# Patient Record
Sex: Female | Born: 1970
Health system: Southern US, Community
[De-identification: ages and names within clinical notes are randomized; demographics above are authoritative.]

## PROBLEM LIST (undated history)

## (undated) DIAGNOSIS — G43909 Migraine, unspecified, not intractable, without status migrainosus: Secondary | ICD-10-CM

---

## 1998-03-12 ENCOUNTER — Emergency Department (HOSPITAL_COMMUNITY): Admission: EM | Admit: 1998-03-12 | Discharge: 1998-03-12 | Payer: Self-pay | Admitting: Emergency Medicine

## 2001-03-10 ENCOUNTER — Ambulatory Visit (HOSPITAL_COMMUNITY): Admission: RE | Admit: 2001-03-10 | Discharge: 2001-03-10 | Payer: Self-pay | Admitting: Obstetrics and Gynecology

## 2001-03-10 ENCOUNTER — Encounter: Payer: Self-pay | Admitting: Obstetrics and Gynecology

## 2001-06-03 ENCOUNTER — Encounter (INDEPENDENT_AMBULATORY_CARE_PROVIDER_SITE_OTHER): Payer: Self-pay | Admitting: *Deleted

## 2001-06-03 ENCOUNTER — Ambulatory Visit (HOSPITAL_BASED_OUTPATIENT_CLINIC_OR_DEPARTMENT_OTHER): Admission: RE | Admit: 2001-06-03 | Discharge: 2001-06-03 | Payer: Self-pay | Admitting: *Deleted

## 2001-10-20 ENCOUNTER — Other Ambulatory Visit: Admission: RE | Admit: 2001-10-20 | Discharge: 2001-10-20 | Payer: Self-pay | Admitting: Obstetrics & Gynecology

## 2002-05-18 ENCOUNTER — Encounter: Payer: Self-pay | Admitting: Obstetrics and Gynecology

## 2002-05-18 ENCOUNTER — Inpatient Hospital Stay (HOSPITAL_COMMUNITY): Admission: AD | Admit: 2002-05-18 | Discharge: 2002-05-20 | Payer: Self-pay | Admitting: Obstetrics & Gynecology

## 2002-06-29 ENCOUNTER — Other Ambulatory Visit: Admission: RE | Admit: 2002-06-29 | Discharge: 2002-06-29 | Payer: Self-pay | Admitting: Obstetrics & Gynecology

## 2008-04-08 ENCOUNTER — Other Ambulatory Visit: Admission: RE | Admit: 2008-04-08 | Discharge: 2008-04-08 | Payer: Self-pay | Admitting: Family Medicine

## 2010-06-23 ENCOUNTER — Ambulatory Visit (HOSPITAL_BASED_OUTPATIENT_CLINIC_OR_DEPARTMENT_OTHER): Admission: RE | Admit: 2010-06-23 | Discharge: 2010-06-23 | Payer: Self-pay | Admitting: Psychology

## 2010-11-15 LAB — POCT HEMOGLOBIN-HEMACUE: Hemoglobin: 12.9 g/dL (ref 12.0–15.0)

## 2011-01-19 NOTE — Op Note (Signed)
   NAME:  Sara Stephens, LAGUE NO.:  [id number]   MEDICAL RECORD NO.:  [id number]                   PATIENT TYPE:  INP   LOCATION:  9165                                 FACILITY:  [facility]   PHYSICIAN:  Lenoard Aden, M.D.             DATE OF BIRTH:  September 25, 1970   DATE OF PROCEDURE:  05/18/2002  DATE OF DISCHARGE:                                 OPERATIVE REPORT   INDICATION FOR OPERATIVE VAGINAL DELIVERY:  Severe right lower quadrant  discomfort with pushing, requests assistance.   POSTOPERATIVE DIAGNOSES:  Severe right lower quadrant discomfort with  pushing, requests assistance.   PROCEDURE:  Outlet vacuum assisted delivery for maternal exhaustion.   DESCRIPTION OF PROCEDURE:  After apprised of the risks, benefits of vacuum  assistance versus redosing of her epidural, patient desires to proceed with  vacuum assistance.  Cervix at 10 cm dilatation.  Fetal vertex at +3 station,  LOA, less than 45 degrees.  MityVac mushroom cup is applied after removal of  the Foley and proper location for three pulls of a full-term living female  over second degree perineal laceration.  Apgars 8 and 9.  Bulb suction  performed.  Placenta delivered spontaneously intact.  Three vessel cord.  There is a small internal left of midline vaginal laceration which is  repaired with a 3-0 Rapide and two small left and left periurethral  lacerations which have two interrupted 3-0 Rapide sutures placed.  Cervix is  without any evidence of any lacerations.  Rectal examination is intact.  Cervix is without lacerations.  Estimated blood loss 600 cc.  Mother and  baby recovering in good condition.                                               Lenoard Aden, M.D.    RJT/MEDQ  D:  05/18/2002  T:  05/18/2002  Job:  [id number]   cc:   [facility]

## 2011-01-19 NOTE — Op Note (Signed)
[place]  Patient:    Sara Stephens, Sara Stephens Visit Number: [id number] MRN: [id number]          Service Type: DSU Location: [facility] Attending Physician:  Vikki Ports Dictated by:   Earna Coder, M.D. Proc. Date: 06/03/01 Admit Date:  06/03/2001   CC:         Silverio Lay, M.D.   Operative Report  PREOPERATIVE DIAGNOSIS:  Right chest wall mass.  POSTOPERATIVE DIAGNOSIS:  Right chest wall mass, lipoma.  OPERATION PERFORMED:  Excision of right anterior chest wall mass.  SURGEON:  Stephenie Acres, M.D.  ANESTHESIA:  Local MAC.  DESCRIPTION OF PROCEDURE:  The patient was taken to the operating room and placed in supine position.  After adequate MAC anesthesia was induced, the right chest was prepped and draped in normal sterile fashion.  Using 1% lidocaine local anesthesia, the skin in the infraclavicular region was anesthetized.  The subcutaneous tissues were also anesthetized.  Small 2 cm incision was made over the mass, dissected down through subcutaneous tissue onto a well encapsulated fatty mass.  This was multilobulated, was actually much larger than anticipated but all delivered itself easily through the wound.  It measured approximately 5 cm.  Adequate hemostasis was ensured.  The skin was closed with subcuticular 4-0 Monocryl.  Dermabond dressing was placed.  The patient tolerated the procedure well and went to PACU in good condition. Dictated by:   Earna Coder, M.D. Attending Physician:  Danna Hefty R. DD:  06/03/01 TD:  06/03/01 Job: [id number]

## 2011-06-26 ENCOUNTER — Other Ambulatory Visit: Payer: Self-pay | Admitting: Family Medicine

## 2011-06-26 DIAGNOSIS — R102 Pelvic and perineal pain: Secondary | ICD-10-CM

## 2011-06-27 ENCOUNTER — Ambulatory Visit
Admission: RE | Admit: 2011-06-27 | Discharge: 2011-06-27 | Disposition: A | Discharge status: Home / Self Care | Payer: BC Managed Care – PPO | Source: Ambulatory Visit | Attending: Family Medicine | Admitting: Family Medicine

## 2011-06-27 DIAGNOSIS — R102 Pelvic and perineal pain: Secondary | ICD-10-CM

## 2012-06-05 ENCOUNTER — Ambulatory Visit (INDEPENDENT_AMBULATORY_CARE_PROVIDER_SITE_OTHER): Payer: BC Managed Care – PPO | Admitting: Licensed Clinical Social Worker

## 2012-06-05 DIAGNOSIS — IMO0002 Reserved for concepts with insufficient information to code with codable children: Secondary | ICD-10-CM

## 2012-06-19 ENCOUNTER — Ambulatory Visit (INDEPENDENT_AMBULATORY_CARE_PROVIDER_SITE_OTHER): Payer: BC Managed Care – PPO | Admitting: Licensed Clinical Social Worker

## 2012-06-19 DIAGNOSIS — IMO0002 Reserved for concepts with insufficient information to code with codable children: Secondary | ICD-10-CM

## 2012-07-03 ENCOUNTER — Ambulatory Visit (INDEPENDENT_AMBULATORY_CARE_PROVIDER_SITE_OTHER): Payer: BC Managed Care – PPO | Admitting: Licensed Clinical Social Worker

## 2012-07-03 DIAGNOSIS — IMO0002 Reserved for concepts with insufficient information to code with codable children: Secondary | ICD-10-CM

## 2012-07-17 ENCOUNTER — Ambulatory Visit: Payer: BC Managed Care – PPO | Admitting: Licensed Clinical Social Worker

## 2012-07-22 ENCOUNTER — Other Ambulatory Visit: Payer: Self-pay | Admitting: Family Medicine

## 2012-07-22 ENCOUNTER — Other Ambulatory Visit (HOSPITAL_COMMUNITY)
Admission: RE | Admit: 2012-07-22 | Discharge: 2012-07-22 | Disposition: A | Discharge status: Home / Self Care | Payer: 59 | Source: Ambulatory Visit | Attending: Family Medicine | Admitting: Family Medicine

## 2012-07-22 DIAGNOSIS — Z124 Encounter for screening for malignant neoplasm of cervix: Secondary | ICD-10-CM | POA: Insufficient documentation

## 2012-07-22 DIAGNOSIS — Z113 Encounter for screening for infections with a predominantly sexual mode of transmission: Secondary | ICD-10-CM | POA: Insufficient documentation

## 2012-07-22 DIAGNOSIS — Z1151 Encounter for screening for human papillomavirus (HPV): Secondary | ICD-10-CM | POA: Insufficient documentation

## 2012-07-24 ENCOUNTER — Ambulatory Visit (INDEPENDENT_AMBULATORY_CARE_PROVIDER_SITE_OTHER): Payer: 59 | Admitting: Licensed Clinical Social Worker

## 2012-07-24 DIAGNOSIS — IMO0002 Reserved for concepts with insufficient information to code with codable children: Secondary | ICD-10-CM

## 2012-08-07 ENCOUNTER — Ambulatory Visit: Payer: 59 | Admitting: Licensed Clinical Social Worker

## 2012-08-12 ENCOUNTER — Ambulatory Visit (INDEPENDENT_AMBULATORY_CARE_PROVIDER_SITE_OTHER): Payer: 59 | Admitting: Licensed Clinical Social Worker

## 2012-08-12 DIAGNOSIS — IMO0002 Reserved for concepts with insufficient information to code with codable children: Secondary | ICD-10-CM

## 2012-09-04 ENCOUNTER — Ambulatory Visit: Payer: Self-pay | Admitting: Licensed Clinical Social Worker

## 2012-09-08 ENCOUNTER — Ambulatory Visit (INDEPENDENT_AMBULATORY_CARE_PROVIDER_SITE_OTHER): Payer: 59 | Admitting: Licensed Clinical Social Worker

## 2012-09-08 DIAGNOSIS — IMO0002 Reserved for concepts with insufficient information to code with codable children: Secondary | ICD-10-CM

## 2012-10-08 ENCOUNTER — Ambulatory Visit: Payer: 59 | Admitting: Licensed Clinical Social Worker

## 2013-06-12 ENCOUNTER — Ambulatory Visit: Payer: BC Managed Care – PPO

## 2013-06-19 ENCOUNTER — Ambulatory Visit (INDEPENDENT_AMBULATORY_CARE_PROVIDER_SITE_OTHER): Payer: BC Managed Care – PPO

## 2013-06-19 DIAGNOSIS — Z23 Encounter for immunization: Secondary | ICD-10-CM

## 2013-07-09 ENCOUNTER — Encounter: Payer: Self-pay | Admitting: *Deleted

## 2014-10-05 ENCOUNTER — Other Ambulatory Visit: Payer: Self-pay | Admitting: Family Medicine

## 2014-10-05 DIAGNOSIS — N926 Irregular menstruation, unspecified: Secondary | ICD-10-CM

## 2014-10-07 ENCOUNTER — Other Ambulatory Visit: Payer: Self-pay

## 2015-04-13 ENCOUNTER — Ambulatory Visit (INDEPENDENT_AMBULATORY_CARE_PROVIDER_SITE_OTHER): Payer: BLUE CROSS/BLUE SHIELD | Admitting: Clinical

## 2015-04-13 ENCOUNTER — Encounter (HOSPITAL_COMMUNITY): Payer: Self-pay | Admitting: Clinical

## 2015-04-13 ENCOUNTER — Encounter (INDEPENDENT_AMBULATORY_CARE_PROVIDER_SITE_OTHER): Payer: Self-pay

## 2015-04-13 DIAGNOSIS — F411 Generalized anxiety disorder: Secondary | ICD-10-CM
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
## 2015-05-17 ENCOUNTER — Ambulatory Visit (INDEPENDENT_AMBULATORY_CARE_PROVIDER_SITE_OTHER): Payer: BLUE CROSS/BLUE SHIELD | Admitting: Clinical

## 2015-05-17 ENCOUNTER — Encounter (HOSPITAL_COMMUNITY): Payer: Self-pay | Admitting: Clinical

## 2015-05-17 DIAGNOSIS — F411 Generalized anxiety disorder: Secondary | ICD-10-CM
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
## 2015-05-31 ENCOUNTER — Ambulatory Visit (HOSPITAL_COMMUNITY): Payer: Self-pay | Admitting: Clinical

## 2015-06-14 ENCOUNTER — Ambulatory Visit (HOSPITAL_COMMUNITY): Payer: Self-pay | Admitting: Clinical

## 2016-02-21 ENCOUNTER — Other Ambulatory Visit: Payer: Self-pay | Admitting: Physician Assistant

## 2016-02-21 DIAGNOSIS — R102 Pelvic and perineal pain: Secondary | ICD-10-CM

## 2016-03-02 ENCOUNTER — Other Ambulatory Visit: Payer: Self-pay

## 2016-03-31 DIAGNOSIS — H52223 Regular astigmatism, bilateral: Secondary | ICD-10-CM | POA: Diagnosis not present

## 2016-05-30 DIAGNOSIS — Z23 Encounter for immunization: Secondary | ICD-10-CM | POA: Diagnosis not present

## 2016-06-19 DIAGNOSIS — Z713 Dietary counseling and surveillance: Secondary | ICD-10-CM | POA: Diagnosis not present

## 2016-07-17 DIAGNOSIS — Z713 Dietary counseling and surveillance: Secondary | ICD-10-CM | POA: Diagnosis not present

## 2016-07-30 DIAGNOSIS — Z713 Dietary counseling and surveillance: Secondary | ICD-10-CM | POA: Diagnosis not present

## 2016-08-13 DIAGNOSIS — Z713 Dietary counseling and surveillance: Secondary | ICD-10-CM | POA: Diagnosis not present

## 2016-09-11 DIAGNOSIS — Z713 Dietary counseling and surveillance: Secondary | ICD-10-CM | POA: Diagnosis not present

## 2016-10-01 DIAGNOSIS — Z713 Dietary counseling and surveillance: Secondary | ICD-10-CM | POA: Diagnosis not present

## 2016-10-25 ENCOUNTER — Encounter (HOSPITAL_COMMUNITY): Payer: Self-pay | Admitting: Emergency Medicine

## 2016-10-25 ENCOUNTER — Emergency Department (HOSPITAL_COMMUNITY): Payer: Worker's Compensation

## 2016-10-25 ENCOUNTER — Emergency Department (HOSPITAL_COMMUNITY)
Admission: EM | Admit: 2016-10-25 | Discharge: 2016-10-25 | Disposition: A | Discharge status: Home / Self Care | Payer: Worker's Compensation | Attending: Emergency Medicine | Admitting: Emergency Medicine

## 2016-10-25 DIAGNOSIS — F17210 Nicotine dependence, cigarettes, uncomplicated: Secondary | ICD-10-CM | POA: Diagnosis not present

## 2016-10-25 DIAGNOSIS — Y939 Activity, unspecified: Secondary | ICD-10-CM | POA: Diagnosis not present

## 2016-10-25 DIAGNOSIS — Y92512 Supermarket, store or market as the place of occurrence of the external cause: Secondary | ICD-10-CM | POA: Insufficient documentation

## 2016-10-25 DIAGNOSIS — Y999 Unspecified external cause status: Secondary | ICD-10-CM | POA: Insufficient documentation

## 2016-10-25 DIAGNOSIS — S0990XA Unspecified injury of head, initial encounter: Secondary | ICD-10-CM

## 2016-10-25 DIAGNOSIS — W208XXA Other cause of strike by thrown, projected or falling object, initial encounter: Secondary | ICD-10-CM | POA: Insufficient documentation

## 2016-10-25 DIAGNOSIS — Z23 Encounter for immunization: Secondary | ICD-10-CM | POA: Insufficient documentation

## 2016-10-25 DIAGNOSIS — S060X0A Concussion without loss of consciousness, initial encounter: Secondary | ICD-10-CM | POA: Diagnosis not present

## 2016-10-25 HISTORY — DX: Migraine, unspecified, not intractable, without status migrainosus: G43.909

## 2016-10-25 MED ORDER — TETANUS-DIPHTH-ACELL PERTUSSIS 5-2.5-18.5 LF-MCG/0.5 IM SUSP
0.5000 mL | Freq: Once | INTRAMUSCULAR | Status: AC
  Administered 2016-10-25: 0.5 mL via INTRAMUSCULAR
  Filled 2016-10-25: qty 0.5

## 2016-10-25 MED ORDER — ONDANSETRON 4 MG PO TBDP
4.0000 mg | ORAL_TABLET | Freq: Once | ORAL | Status: AC
  Administered 2016-10-25: 4 mg via ORAL
  Filled 2016-10-25: qty 1

## 2016-10-25 MED ORDER — ACETAMINOPHEN 325 MG PO TABS
650.0000 mg | ORAL_TABLET | Freq: Once | ORAL | Status: AC
  Administered 2016-10-25: 650 mg via ORAL
  Filled 2016-10-25: qty 2

## 2016-10-25 MED ORDER — ONDANSETRON 4 MG PO TBDP: 4.0000 mg | ORAL_TABLET | Freq: Three times a day (TID) | ORAL | 0 refills | Status: AC | PRN
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
## 2016-10-25 NOTE — ED Provider Notes (Signed)
[facility]-EMERGENCY DEPT Provider Note   CSN: [id number] Arrival date & time: 10/25/16  1132  By signing my name below, I, Sara Stephens, attest that this documentation has been prepared under the direction and in the presence of Elyzabeth Goatley Y. Mert Dietrick, PA-C. Electronically Signed: Marnette Burgess Stephens, Scribe. 10/25/2016. 11:59 AM.   History   Chief Complaint Chief Complaint  Patient presents with  . Head Injury    The history is provided by the patient. No language interpreter was used.    HPI Comments:  Sara Stephens is a 46 y.o. female with PMHx of Migraines, who presents to the Emergency Department complaining of a sudden onset head injury onset one hour ago. Per pt, she was riding in the elevator at the [employer] building downtown when a board fell from the elevator ceiling and struck the top of her left head leaving a 0.5cm laceration near the hairline. She denies LOC. She reports she currently feels tired with head pain and associated nausea. Pt is not currently on anticoagulant or antiplatelet therapy. She did not take anything PTA for relief of her pain. She states no trouble ambulating. Pt denies visual disturbance, vomiting, dizziness, or any other injuries at this time. She is unsure of her last TDAP vaccination. Pt is a current every day smoker.    Past Medical History:  Diagnosis Date  . Migraine     There are no active problems to display for this patient.   History reviewed. No pertinent surgical history.  OB History    No data available       Home Medications    Prior to Admission medications   Medication Sig Start Date End Date Taking? Authorizing Provider  levothyroxine (SYNTHROID, LEVOTHROID) 75 MCG tablet Take 75 mcg by mouth daily before breakfast.    Historical Provider, MD  topiramate (TOPAMAX) 200 MG tablet Take 200 mg by mouth 2 (two) times daily.    Historical Provider, MD  venlafaxine XR (EFFEXOR-XR) 150 MG 24 hr capsule Take 150 mg by mouth daily  with breakfast.    Historical Provider, MD    Family History History reviewed. No pertinent family history.  Social History Social History  Substance Use Topics  . Smoking status: Current Every Day Smoker    Packs/day: 0.50    Types: Cigarettes  . Smokeless tobacco: Never Used  . Alcohol use No     Allergies   Amoxicillin and Vicodin [hydrocodone-acetaminophen]   Review of Systems Review of Systems  Eyes: Negative for visual disturbance.  Gastrointestinal: Positive for nausea. Negative for vomiting.  Neurological: Positive for headaches. Negative for dizziness and syncope.  All other systems reviewed and are negative.    Physical Exam Updated Vital Signs BP 121/80 (BP Location: Left Arm)   Pulse 76   Temp 97.7 F (36.5 C) (Oral)   Resp 18   Ht 5\' 7"  (1.702 m)   Wt 118 lb (53.5 kg)   LMP 10/18/2016   SpO2 100%   BMI 18.48 kg/m   Physical Exam  Constitutional: She is oriented to person, place, and time. She appears well-developed and well-nourished.  HENT:  Head: Normocephalic. Head is without raccoon's eyes and without Battle's sign.    Right Ear: Tympanic membrane and external ear normal. No hemotympanum.  Left Ear: Tympanic membrane and external ear normal. No hemotympanum.  Eyes: Conjunctivae and EOM are normal. Pupils are equal, round, and reactive to light.  Neck: Normal range of motion. Neck supple.  No c-spine  tenderness  Cardiovascular: Normal rate, regular rhythm and normal heart sounds.   Pulmonary/Chest: Effort normal and breath sounds normal.  Abdominal: She exhibits no distension.  Musculoskeletal: Normal range of motion.  Neurological: She is alert and oriented to person, place, and time. She displays normal reflexes. No cranial nerve deficit. Coordination normal.  Skin: Skin is warm and dry.  Psychiatric: She has a normal mood and affect.  Nursing note and vitals reviewed.    ED Treatments / Results  DIAGNOSTIC STUDIES:  Oxygen  Saturation is 100% on RA, normal by my interpretation.    COORDINATION OF CARE:  11:53 AM Discussed treatment plan with pt at bedside including CT of her head, Tylenol, and anti-emetics and pt agreed to plan. Pt also advised for f/u with PCP in a week if head injury worsens.   Labs (all labs ordered are listed, but only abnormal results are displayed) Labs Reviewed - No data to display  EKG  EKG Interpretation None       Radiology Ct Head Wo Contrast  Result Date: 10/25/2016 CLINICAL DATA:  Piece of wood struck the top of the head at work. EXAM: CT HEAD WITHOUT CONTRAST TECHNIQUE: Contiguous axial images were obtained from the base of the skull through the vertex without intravenous contrast. COMPARISON:  01/08/2008 FINDINGS: Brain: No evidence of malformation, atrophy, old or acute small or large vessel infarction, mass lesion, hemorrhage, hydrocephalus or extra-axial collection. No evidence of pituitary lesion. Vascular: No vascular calcification.  No hyperdense vessels. Skull: Normal.  No fracture or focal bone lesion. Sinuses/Orbits: Visualized sinuses are clear. No fluid in the middle ears or mastoids. Visualized orbits are normal. Other: None significant IMPRESSION: Normal head CT Electronically Signed   By: Paulina FusiMark  Shogry M.D.   On: 10/25/2016 13:02    Procedures Procedures (including critical care time)  Medications Ordered in ED Medications - No data to display   Initial Impression / Assessment and Plan / ED Course  I have reviewed the triage vital signs and the nursing notes.  Pertinent labs & imaging results that were available during my care of the patient were reviewed by me and considered in my medical decision making (see chart for details).    Pt presenting after head injury. CT negative. She has a small abrasion as noted above which we cleaned. Tetanus updated. Neuro exam intact. Suspect mild concussion. Have given supportive medications and reviewed concussion  guidelines. Instructed close f/u with pcp. Return precautions given.  Final Clinical Impressions(s) / ED Diagnoses   Final diagnoses:  Injury of head, initial encounter  Concussion without loss of consciousness, initial encounter    New Prescriptions Discharge Medication List as of 10/25/2016  1:08 PM    START taking these medications   Details  ondansetron (ZOFRAN ODT) 4 MG disintegrating tablet Take 1 tablet (4 mg total) by mouth every 8 (eight) hours as needed for nausea or vomiting., Starting Thu 10/25/2016, Print       I personally performed the services described in this documentation, which was scribed in my presence. The recorded information has been reviewed and is accurate.    Carlene CoriaSerena Y Chania Kochanski, PA-C 10/25/16 1342    Shaune Pollackameron Isaacs, MD 10/26/16 [phone]

## 2016-10-25 NOTE — ED Triage Notes (Addendum)
Patient reports a board fell out of the ceiling of the elevator and hit her in the head this morning. Abrasion to left head. C/o headache. Denies LOC, blurred vision and vomiting. Ambulatory to triage. A&Ox4.

## 2016-11-05 DIAGNOSIS — S63502A Unspecified sprain of left wrist, initial encounter: Secondary | ICD-10-CM | POA: Diagnosis not present

## 2016-12-21 DIAGNOSIS — F419 Anxiety disorder, unspecified: Secondary | ICD-10-CM | POA: Diagnosis not present

## 2016-12-21 DIAGNOSIS — G4700 Insomnia, unspecified: Secondary | ICD-10-CM | POA: Diagnosis not present

## 2016-12-21 DIAGNOSIS — E039 Hypothyroidism, unspecified: Secondary | ICD-10-CM | POA: Diagnosis not present

## 2016-12-21 DIAGNOSIS — G43909 Migraine, unspecified, not intractable, without status migrainosus: Secondary | ICD-10-CM | POA: Diagnosis not present

## 2017-03-05 DIAGNOSIS — H66001 Acute suppurative otitis media without spontaneous rupture of ear drum, right ear: Secondary | ICD-10-CM | POA: Diagnosis not present

## 2017-06-05 DIAGNOSIS — Z23 Encounter for immunization: Secondary | ICD-10-CM | POA: Diagnosis not present

## 2017-06-05 DIAGNOSIS — E039 Hypothyroidism, unspecified: Secondary | ICD-10-CM | POA: Diagnosis not present

## 2017-06-05 DIAGNOSIS — Z0000 Encounter for general adult medical examination without abnormal findings: Secondary | ICD-10-CM | POA: Diagnosis not present

## 2017-08-19 DIAGNOSIS — J019 Acute sinusitis, unspecified: Secondary | ICD-10-CM | POA: Diagnosis not present

## 2018-03-19 DIAGNOSIS — Z975 Presence of (intrauterine) contraceptive device: Secondary | ICD-10-CM | POA: Diagnosis not present

## 2018-03-19 DIAGNOSIS — Z6820 Body mass index (BMI) 20.0-20.9, adult: Secondary | ICD-10-CM | POA: Diagnosis not present

## 2018-03-19 DIAGNOSIS — Z720 Tobacco use: Secondary | ICD-10-CM | POA: Diagnosis not present

## 2018-03-27 DIAGNOSIS — Z30430 Encounter for insertion of intrauterine contraceptive device: Secondary | ICD-10-CM | POA: Diagnosis not present

## 2018-04-25 ENCOUNTER — Other Ambulatory Visit: Payer: Self-pay | Admitting: Family Medicine

## 2018-04-25 ENCOUNTER — Other Ambulatory Visit (HOSPITAL_COMMUNITY)
Admission: RE | Admit: 2018-04-25 | Discharge: 2018-04-25 | Disposition: A | Discharge status: Home / Self Care | Payer: BLUE CROSS/BLUE SHIELD | Source: Ambulatory Visit | Attending: Family Medicine | Admitting: Family Medicine

## 2018-04-25 DIAGNOSIS — Z124 Encounter for screening for malignant neoplasm of cervix: Secondary | ICD-10-CM | POA: Insufficient documentation

## 2018-04-25 DIAGNOSIS — Z30433 Encounter for removal and reinsertion of intrauterine contraceptive device: Secondary | ICD-10-CM | POA: Diagnosis not present

## 2018-04-25 DIAGNOSIS — Z3202 Encounter for pregnancy test, result negative: Secondary | ICD-10-CM | POA: Diagnosis not present

## 2018-04-29 LAB — CYTOLOGY - PAP
Diagnosis: NEGATIVE
HPV: NOT DETECTED

## 2018-05-21 DIAGNOSIS — B029 Zoster without complications: Secondary | ICD-10-CM | POA: Diagnosis not present

## 2018-06-03 DIAGNOSIS — Z23 Encounter for immunization: Secondary | ICD-10-CM | POA: Diagnosis not present

## 2018-06-27 DIAGNOSIS — D485 Neoplasm of uncertain behavior of skin: Secondary | ICD-10-CM | POA: Diagnosis not present

## 2018-06-27 DIAGNOSIS — L130 Dermatitis herpetiformis: Secondary | ICD-10-CM | POA: Diagnosis not present

## 2018-06-30 DIAGNOSIS — L989 Disorder of the skin and subcutaneous tissue, unspecified: Secondary | ICD-10-CM | POA: Diagnosis not present

## 2018-07-01 DIAGNOSIS — L309 Dermatitis, unspecified: Secondary | ICD-10-CM | POA: Diagnosis not present

## 2018-07-02 DIAGNOSIS — L988 Other specified disorders of the skin and subcutaneous tissue: Secondary | ICD-10-CM | POA: Diagnosis not present

## 2018-07-02 DIAGNOSIS — L989 Disorder of the skin and subcutaneous tissue, unspecified: Secondary | ICD-10-CM | POA: Diagnosis not present

## 2018-07-09 DIAGNOSIS — L309 Dermatitis, unspecified: Secondary | ICD-10-CM | POA: Diagnosis not present

## 2018-10-30 DIAGNOSIS — E039 Hypothyroidism, unspecified: Secondary | ICD-10-CM | POA: Diagnosis not present

## 2018-10-30 DIAGNOSIS — G43909 Migraine, unspecified, not intractable, without status migrainosus: Secondary | ICD-10-CM | POA: Diagnosis not present

## 2018-10-30 DIAGNOSIS — Z6820 Body mass index (BMI) 20.0-20.9, adult: Secondary | ICD-10-CM | POA: Diagnosis not present

## 2018-10-30 DIAGNOSIS — Z720 Tobacco use: Secondary | ICD-10-CM | POA: Diagnosis not present

## 2019-03-25 DIAGNOSIS — E039 Hypothyroidism, unspecified: Secondary | ICD-10-CM | POA: Diagnosis not present

## 2019-03-25 DIAGNOSIS — G43909 Migraine, unspecified, not intractable, without status migrainosus: Secondary | ICD-10-CM | POA: Diagnosis not present

## 2019-03-25 DIAGNOSIS — G4700 Insomnia, unspecified: Secondary | ICD-10-CM | POA: Diagnosis not present

## 2019-03-25 DIAGNOSIS — F419 Anxiety disorder, unspecified: Secondary | ICD-10-CM | POA: Diagnosis not present

## 2019-04-22 DIAGNOSIS — E039 Hypothyroidism, unspecified: Secondary | ICD-10-CM | POA: Diagnosis not present

## 2019-04-22 DIAGNOSIS — Z79899 Other long term (current) drug therapy: Secondary | ICD-10-CM | POA: Diagnosis not present

## 2019-06-11 ENCOUNTER — Other Ambulatory Visit: Payer: Self-pay

## 2019-06-11 DIAGNOSIS — Z20822 Contact with and (suspected) exposure to covid-19: Secondary | ICD-10-CM

## 2019-06-13 LAB — NOVEL CORONAVIRUS, NAA: SARS-CoV-2, NAA: NOT DETECTED

## 2019-07-10 DIAGNOSIS — Z713 Dietary counseling and surveillance: Secondary | ICD-10-CM | POA: Diagnosis not present

## 2019-07-24 DIAGNOSIS — Z713 Dietary counseling and surveillance: Secondary | ICD-10-CM | POA: Diagnosis not present

## 2019-08-07 DIAGNOSIS — Z713 Dietary counseling and surveillance: Secondary | ICD-10-CM | POA: Diagnosis not present

## 2019-08-12 DIAGNOSIS — Z713 Dietary counseling and surveillance: Secondary | ICD-10-CM | POA: Diagnosis not present

## 2019-08-21 DIAGNOSIS — Z713 Dietary counseling and surveillance: Secondary | ICD-10-CM | POA: Diagnosis not present

## 2019-09-11 DIAGNOSIS — Z713 Dietary counseling and surveillance: Secondary | ICD-10-CM | POA: Diagnosis not present

## 2019-09-15 DIAGNOSIS — E039 Hypothyroidism, unspecified: Secondary | ICD-10-CM | POA: Diagnosis not present

## 2019-09-15 DIAGNOSIS — F419 Anxiety disorder, unspecified: Secondary | ICD-10-CM | POA: Diagnosis not present

## 2019-09-15 DIAGNOSIS — G43909 Migraine, unspecified, not intractable, without status migrainosus: Secondary | ICD-10-CM | POA: Diagnosis not present

## 2019-09-15 DIAGNOSIS — Z720 Tobacco use: Secondary | ICD-10-CM | POA: Diagnosis not present

## 2019-10-15 DIAGNOSIS — Z20828 Contact with and (suspected) exposure to other viral communicable diseases: Secondary | ICD-10-CM | POA: Diagnosis not present

## 2019-10-20 DIAGNOSIS — Z20828 Contact with and (suspected) exposure to other viral communicable diseases: Secondary | ICD-10-CM | POA: Diagnosis not present

## 2019-11-05 ENCOUNTER — Ambulatory Visit: Payer: BLUE CROSS/BLUE SHIELD

## 2020-02-18 DIAGNOSIS — E039 Hypothyroidism, unspecified: Secondary | ICD-10-CM | POA: Diagnosis not present

## 2020-02-18 DIAGNOSIS — F419 Anxiety disorder, unspecified: Secondary | ICD-10-CM | POA: Diagnosis not present

## 2020-02-18 DIAGNOSIS — G43909 Migraine, unspecified, not intractable, without status migrainosus: Secondary | ICD-10-CM | POA: Diagnosis not present

## 2020-02-18 DIAGNOSIS — E559 Vitamin D deficiency, unspecified: Secondary | ICD-10-CM | POA: Diagnosis not present

## 2020-02-18 DIAGNOSIS — Z720 Tobacco use: Secondary | ICD-10-CM | POA: Diagnosis not present

## 2020-02-24 DIAGNOSIS — Z720 Tobacco use: Secondary | ICD-10-CM | POA: Diagnosis not present

## 2020-02-24 DIAGNOSIS — F419 Anxiety disorder, unspecified: Secondary | ICD-10-CM | POA: Diagnosis not present

## 2020-02-24 DIAGNOSIS — N951 Menopausal and female climacteric states: Secondary | ICD-10-CM | POA: Diagnosis not present

## 2020-02-24 DIAGNOSIS — Z975 Presence of (intrauterine) contraceptive device: Secondary | ICD-10-CM | POA: Diagnosis not present

## 2020-03-03 DIAGNOSIS — R61 Generalized hyperhidrosis: Secondary | ICD-10-CM | POA: Diagnosis not present

## 2020-03-11 DIAGNOSIS — R61 Generalized hyperhidrosis: Secondary | ICD-10-CM | POA: Diagnosis not present

## 2020-04-04 DIAGNOSIS — S01319A Laceration without foreign body of unspecified ear, initial encounter: Secondary | ICD-10-CM | POA: Diagnosis not present

## 2020-04-28 DIAGNOSIS — D2322 Other benign neoplasm of skin of left ear and external auricular canal: Secondary | ICD-10-CM | POA: Diagnosis not present

## 2020-11-30 DIAGNOSIS — F17210 Nicotine dependence, cigarettes, uncomplicated: Secondary | ICD-10-CM | POA: Diagnosis not present

## 2020-11-30 DIAGNOSIS — G43909 Migraine, unspecified, not intractable, without status migrainosus: Secondary | ICD-10-CM | POA: Diagnosis not present

## 2020-11-30 DIAGNOSIS — E039 Hypothyroidism, unspecified: Secondary | ICD-10-CM | POA: Diagnosis not present

## 2020-11-30 DIAGNOSIS — Z6820 Body mass index (BMI) 20.0-20.9, adult: Secondary | ICD-10-CM | POA: Diagnosis not present

## 2021-01-16 ENCOUNTER — Other Ambulatory Visit: Payer: Self-pay | Admitting: Family Medicine

## 2021-01-16 DIAGNOSIS — Z1231 Encounter for screening mammogram for malignant neoplasm of breast: Secondary | ICD-10-CM

## 2021-01-31 DIAGNOSIS — F17200 Nicotine dependence, unspecified, uncomplicated: Secondary | ICD-10-CM | POA: Diagnosis not present

## 2021-01-31 DIAGNOSIS — E039 Hypothyroidism, unspecified: Secondary | ICD-10-CM | POA: Diagnosis not present

## 2021-01-31 DIAGNOSIS — Z0000 Encounter for general adult medical examination without abnormal findings: Secondary | ICD-10-CM | POA: Diagnosis not present

## 2021-01-31 DIAGNOSIS — E559 Vitamin D deficiency, unspecified: Secondary | ICD-10-CM | POA: Diagnosis not present

## 2021-01-31 DIAGNOSIS — Z8249 Family history of ischemic heart disease and other diseases of the circulatory system: Secondary | ICD-10-CM | POA: Diagnosis not present

## 2021-01-31 DIAGNOSIS — Z23 Encounter for immunization: Secondary | ICD-10-CM | POA: Diagnosis not present

## 2021-01-31 DIAGNOSIS — Z13220 Encounter for screening for lipoid disorders: Secondary | ICD-10-CM | POA: Diagnosis not present

## 2021-01-31 DIAGNOSIS — Z79899 Other long term (current) drug therapy: Secondary | ICD-10-CM | POA: Diagnosis not present

## 2021-03-15 ENCOUNTER — Ambulatory Visit
Admission: RE | Admit: 2021-03-15 | Discharge: 2021-03-15 | Disposition: A | Discharge status: Home / Self Care | Payer: Self-pay | Source: Ambulatory Visit | Attending: Family Medicine | Admitting: Family Medicine

## 2021-03-15 ENCOUNTER — Other Ambulatory Visit: Payer: Self-pay

## 2021-03-15 DIAGNOSIS — Z1231 Encounter for screening mammogram for malignant neoplasm of breast: Secondary | ICD-10-CM

## 2021-03-16 DIAGNOSIS — M25571 Pain in right ankle and joints of right foot: Secondary | ICD-10-CM | POA: Diagnosis not present

## 2021-03-20 ENCOUNTER — Other Ambulatory Visit: Payer: Self-pay | Admitting: Family Medicine

## 2021-03-20 DIAGNOSIS — R928 Other abnormal and inconclusive findings on diagnostic imaging of breast: Secondary | ICD-10-CM

## 2021-03-24 ENCOUNTER — Other Ambulatory Visit: Payer: Self-pay

## 2021-03-24 ENCOUNTER — Ambulatory Visit
Admission: RE | Admit: 2021-03-24 | Discharge: 2021-03-24 | Disposition: A | Discharge status: Home / Self Care | Payer: BC Managed Care – PPO | Source: Ambulatory Visit | Attending: Family Medicine | Admitting: Family Medicine

## 2021-03-24 DIAGNOSIS — R928 Other abnormal and inconclusive findings on diagnostic imaging of breast: Secondary | ICD-10-CM

## 2021-03-24 DIAGNOSIS — M25571 Pain in right ankle and joints of right foot: Secondary | ICD-10-CM | POA: Diagnosis not present

## 2021-03-24 DIAGNOSIS — R922 Inconclusive mammogram: Secondary | ICD-10-CM | POA: Diagnosis not present

## 2021-03-28 ENCOUNTER — Other Ambulatory Visit: Payer: BC Managed Care – PPO

## 2021-04-01 DIAGNOSIS — H109 Unspecified conjunctivitis: Secondary | ICD-10-CM | POA: Diagnosis not present

## 2021-04-06 ENCOUNTER — Other Ambulatory Visit: Payer: BC Managed Care – PPO

## 2021-04-20 DIAGNOSIS — K621 Rectal polyp: Secondary | ICD-10-CM | POA: Diagnosis not present

## 2021-04-20 DIAGNOSIS — K5730 Diverticulosis of large intestine without perforation or abscess without bleeding: Secondary | ICD-10-CM | POA: Diagnosis not present

## 2021-04-20 DIAGNOSIS — K6289 Other specified diseases of anus and rectum: Secondary | ICD-10-CM | POA: Diagnosis not present

## 2021-04-20 DIAGNOSIS — K648 Other hemorrhoids: Secondary | ICD-10-CM | POA: Diagnosis not present

## 2021-04-20 DIAGNOSIS — R197 Diarrhea, unspecified: Secondary | ICD-10-CM | POA: Diagnosis not present

## 2021-06-30 DIAGNOSIS — F4323 Adjustment disorder with mixed anxiety and depressed mood: Secondary | ICD-10-CM | POA: Diagnosis not present

## 2021-07-06 DIAGNOSIS — F4323 Adjustment disorder with mixed anxiety and depressed mood: Secondary | ICD-10-CM | POA: Diagnosis not present

## 2021-07-13 DIAGNOSIS — F4323 Adjustment disorder with mixed anxiety and depressed mood: Secondary | ICD-10-CM | POA: Diagnosis not present

## 2021-07-26 DIAGNOSIS — F4323 Adjustment disorder with mixed anxiety and depressed mood: Secondary | ICD-10-CM | POA: Diagnosis not present

## 2021-08-18 DIAGNOSIS — F4323 Adjustment disorder with mixed anxiety and depressed mood: Secondary | ICD-10-CM | POA: Diagnosis not present

## 2021-09-11 DIAGNOSIS — F4323 Adjustment disorder with mixed anxiety and depressed mood: Secondary | ICD-10-CM | POA: Diagnosis not present

## 2021-09-21 DIAGNOSIS — F4323 Adjustment disorder with mixed anxiety and depressed mood: Secondary | ICD-10-CM | POA: Diagnosis not present

## 2021-10-14 DIAGNOSIS — R509 Fever, unspecified: Secondary | ICD-10-CM | POA: Diagnosis not present

## 2021-10-14 DIAGNOSIS — R0981 Nasal congestion: Secondary | ICD-10-CM | POA: Diagnosis not present

## 2021-10-14 DIAGNOSIS — R5383 Other fatigue: Secondary | ICD-10-CM | POA: Diagnosis not present

## 2021-10-14 DIAGNOSIS — U071 COVID-19: Secondary | ICD-10-CM | POA: Diagnosis not present

## 2022-03-28 DIAGNOSIS — R5383 Other fatigue: Secondary | ICD-10-CM | POA: Diagnosis not present

## 2022-03-28 DIAGNOSIS — L659 Nonscarring hair loss, unspecified: Secondary | ICD-10-CM | POA: Diagnosis not present

## 2022-03-28 DIAGNOSIS — R232 Flushing: Secondary | ICD-10-CM | POA: Diagnosis not present

## 2022-03-28 DIAGNOSIS — R61 Generalized hyperhidrosis: Secondary | ICD-10-CM | POA: Diagnosis not present

## 2022-05-15 DIAGNOSIS — Z0000 Encounter for general adult medical examination without abnormal findings: Secondary | ICD-10-CM | POA: Diagnosis not present

## 2022-05-15 DIAGNOSIS — F17210 Nicotine dependence, cigarettes, uncomplicated: Secondary | ICD-10-CM | POA: Diagnosis not present

## 2022-05-15 DIAGNOSIS — E039 Hypothyroidism, unspecified: Secondary | ICD-10-CM | POA: Diagnosis not present

## 2022-11-07 DIAGNOSIS — R051 Acute cough: Secondary | ICD-10-CM | POA: Diagnosis not present

## 2022-11-07 DIAGNOSIS — R52 Pain, unspecified: Secondary | ICD-10-CM | POA: Diagnosis not present

## 2022-11-07 DIAGNOSIS — R6883 Chills (without fever): Secondary | ICD-10-CM | POA: Diagnosis not present

## 2022-11-20 DIAGNOSIS — Z720 Tobacco use: Secondary | ICD-10-CM | POA: Diagnosis not present

## 2023-07-08 IMAGING — US US BREAST*R* LIMITED INC AXILLA
1 series · 6 of 6 positions shown · non-contrast
Comparison: 03/15/2021

CLINICAL DATA: Patient returns after screening study for evaluation
of possible RIGHT breast mass.

EXAM:
DIGITAL DIAGNOSTIC UNILATERAL RIGHT MAMMOGRAM WITH TOMOSYNTHESIS AND
CAD; ULTRASOUND RIGHT BREAST LIMITED
TECHNIQUE: Right digital diagnostic mammography and breast tomosynthesis was
performed. The images were evaluated with computer-aided detection.;
Targeted ultrasound examination of the right breast was performed

[Series 1: us breast*right* limited inc axilla · 0.06mm/px · 6 of 6 slices shown]
[im 1/6]
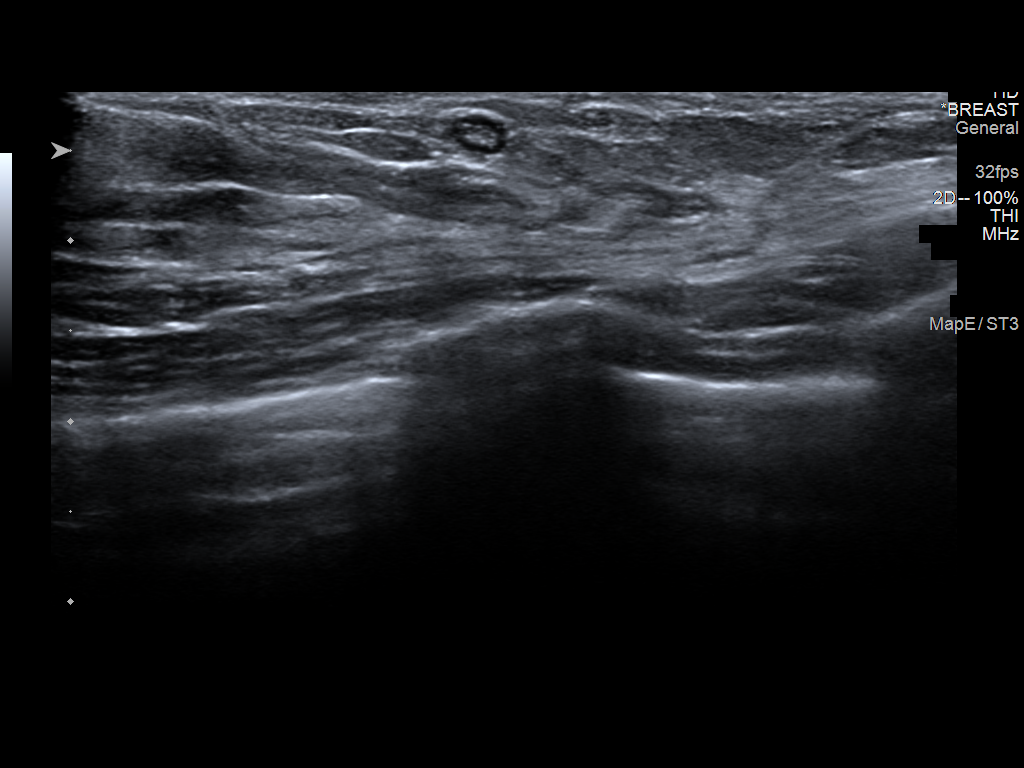
[im 2/6]
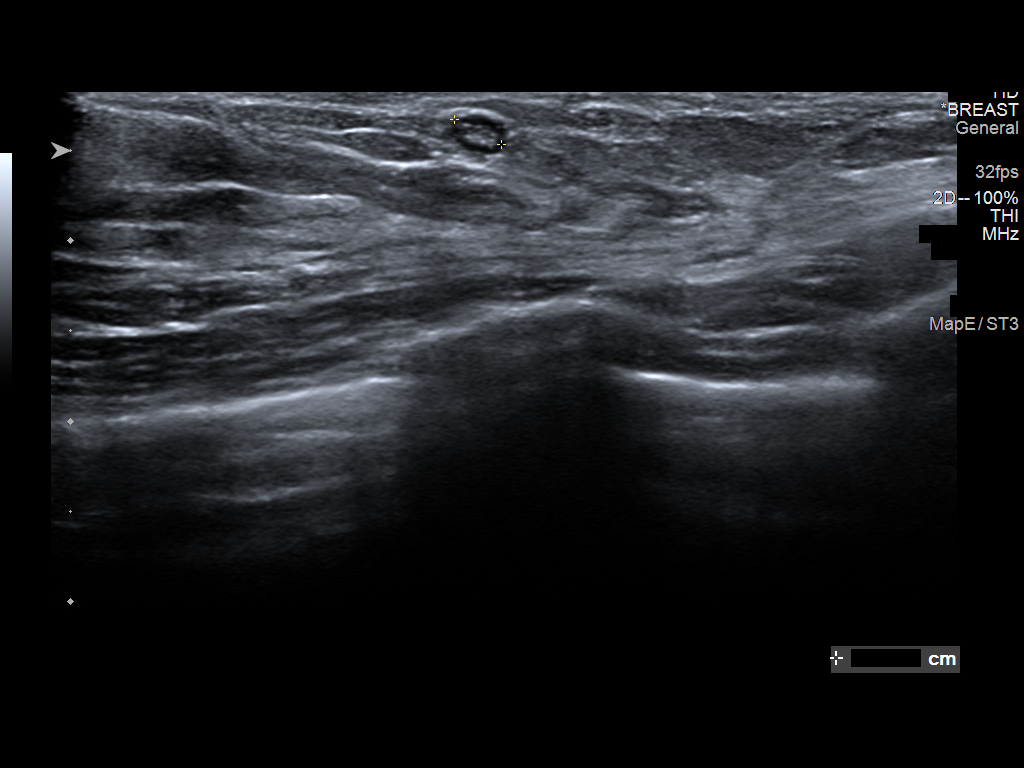
[im 3/6]
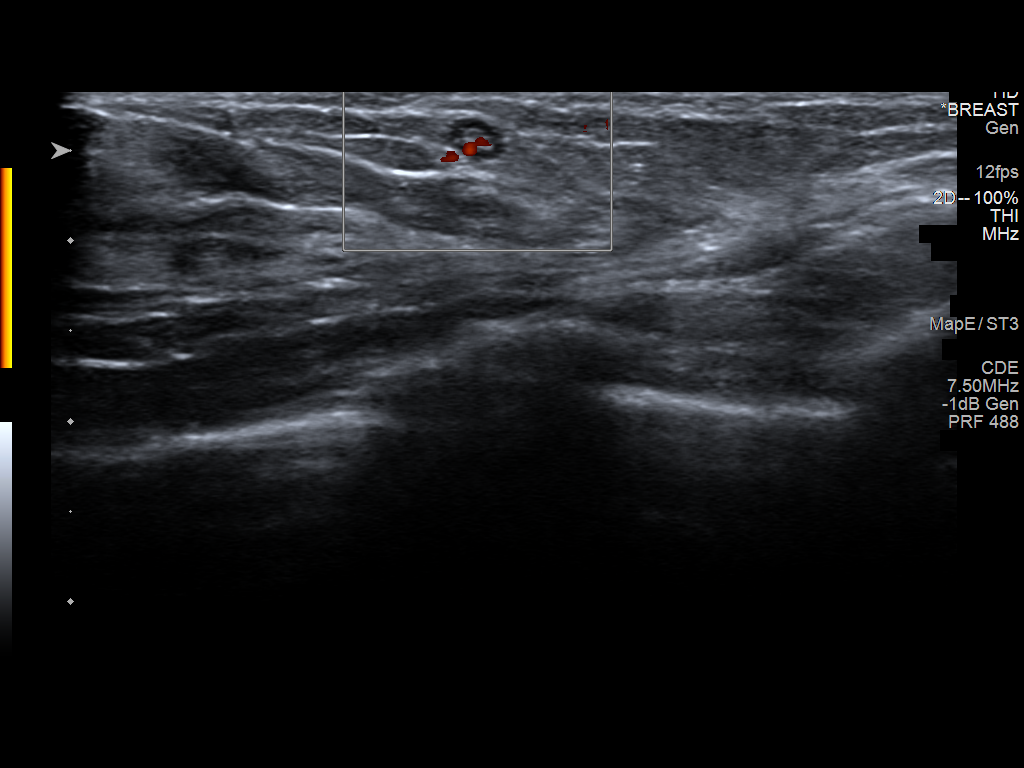
[im 4/6]
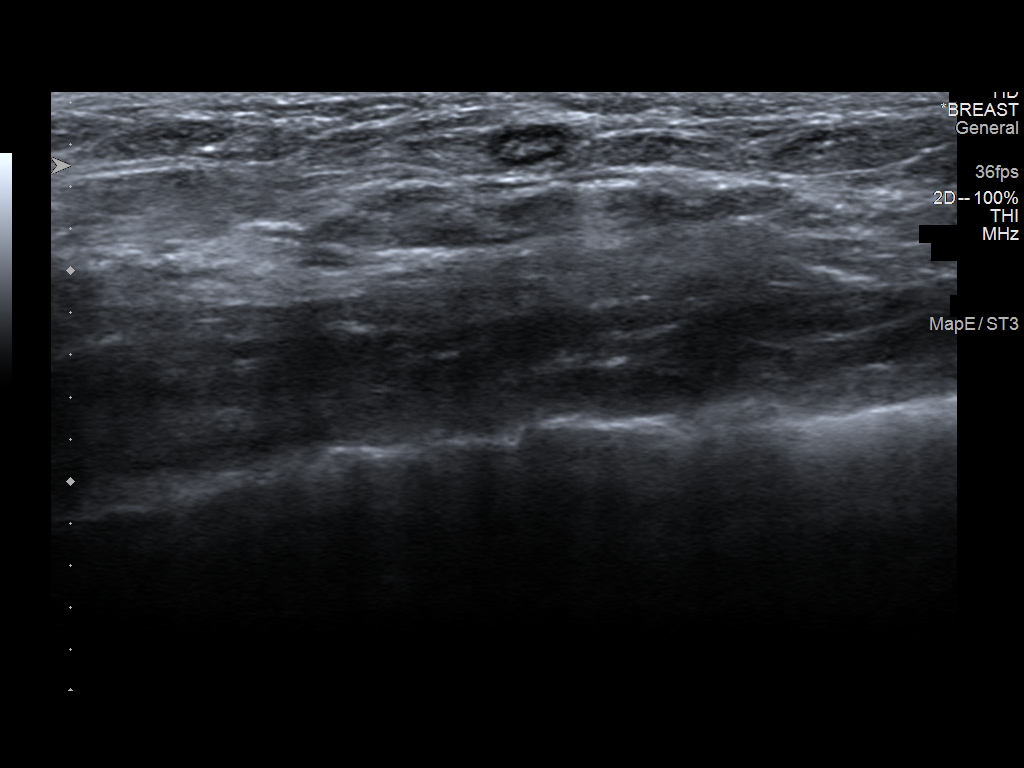
[im 5/6]
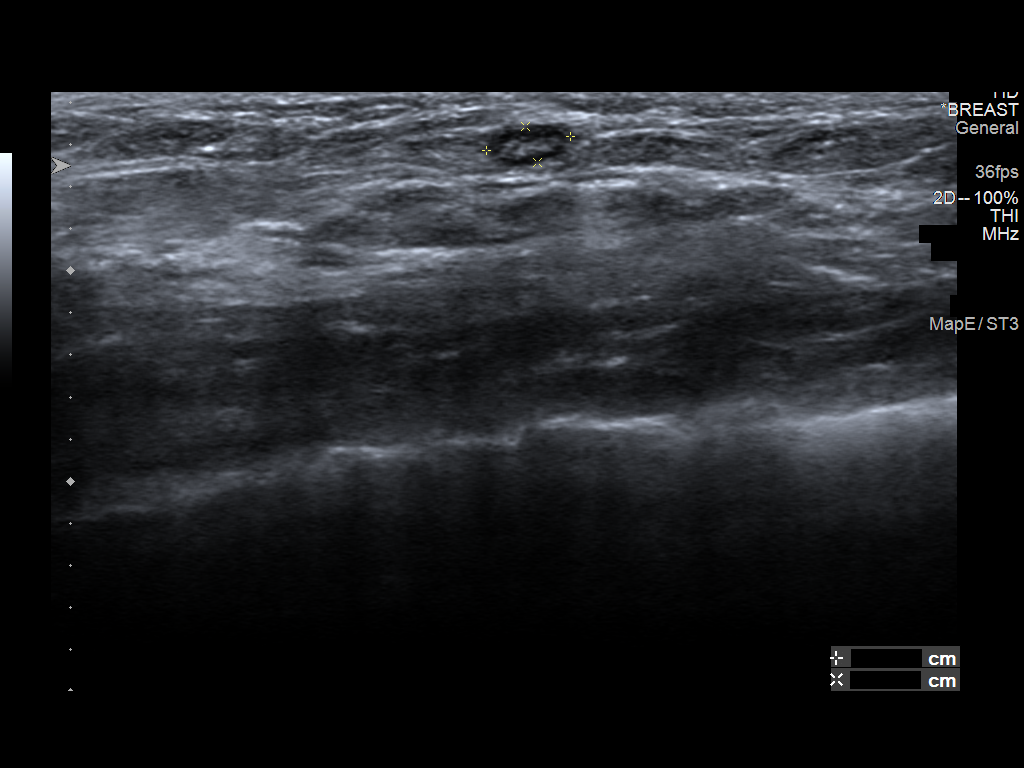
[im 6/6]
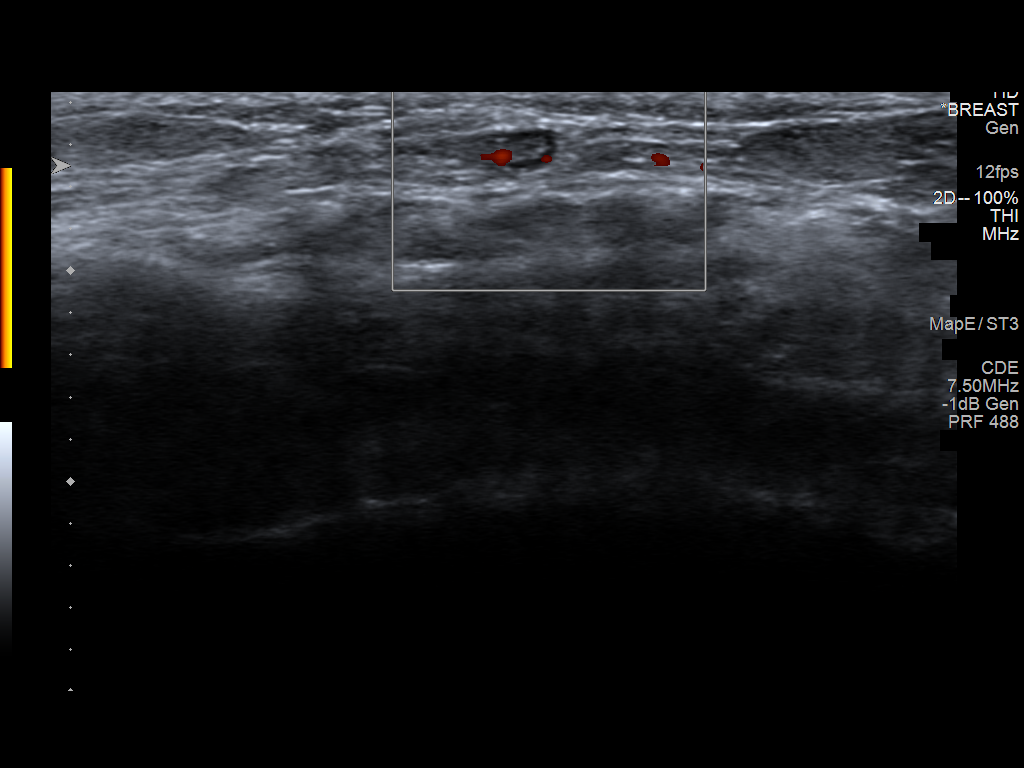

[6 of 6 positions shown; findings below may reference images not displayed]

ACR Breast Density Category c: The breast tissue is heterogeneously
dense, which may obscure small masses.
FINDINGS: Additional 2-D and 3-D images are performed. These views confirm
presence of a circumscribed oval mass in the anterior LOWER OUTER
QUADRANT of the RIGHT breast.

Targeted ultrasound is performed, showing a circumscribed hypoechoic
mass with hyperechoic center in the 7 o'clock location of the RIGHT
breast 3 centimeters from the nipple. Mass measures 0.3 x 0.4 x
centimeters. Internal blood flow is noted, consistent with
intramammary lymph node.
IMPRESSION: Benign intramammary lymph node in the RIGHT breast. No mammographic
or ultrasound evidence for malignancy.

RECOMMENDATION:
Screening mammogram in one year.(Code:9Y-P-LOG)

I have discussed the findings and recommendations with the patient.
If applicable, a reminder letter will be sent to the patient
regarding the next appointment.

BI-RADS CATEGORY  2: Benign.

## 2023-07-08 IMAGING — MG MM DIGITAL DIAGNOSTIC UNILAT*R* W/ TOMO W/ CAD
4 series · 4 of 12 positions shown · non-contrast
Comparison: 03/15/2021

CLINICAL DATA: Patient returns after screening study for evaluation
of possible RIGHT breast mass.

EXAM:
DIGITAL DIAGNOSTIC UNILATERAL RIGHT MAMMOGRAM WITH TOMOSYNTHESIS AND
CAD; ULTRASOUND RIGHT BREAST LIMITED
TECHNIQUE: Right digital diagnostic mammography and breast tomosynthesis was
performed. The images were evaluated with computer-aided detection.;
Targeted ultrasound examination of the right breast was performed

[R MLO synth-2D]
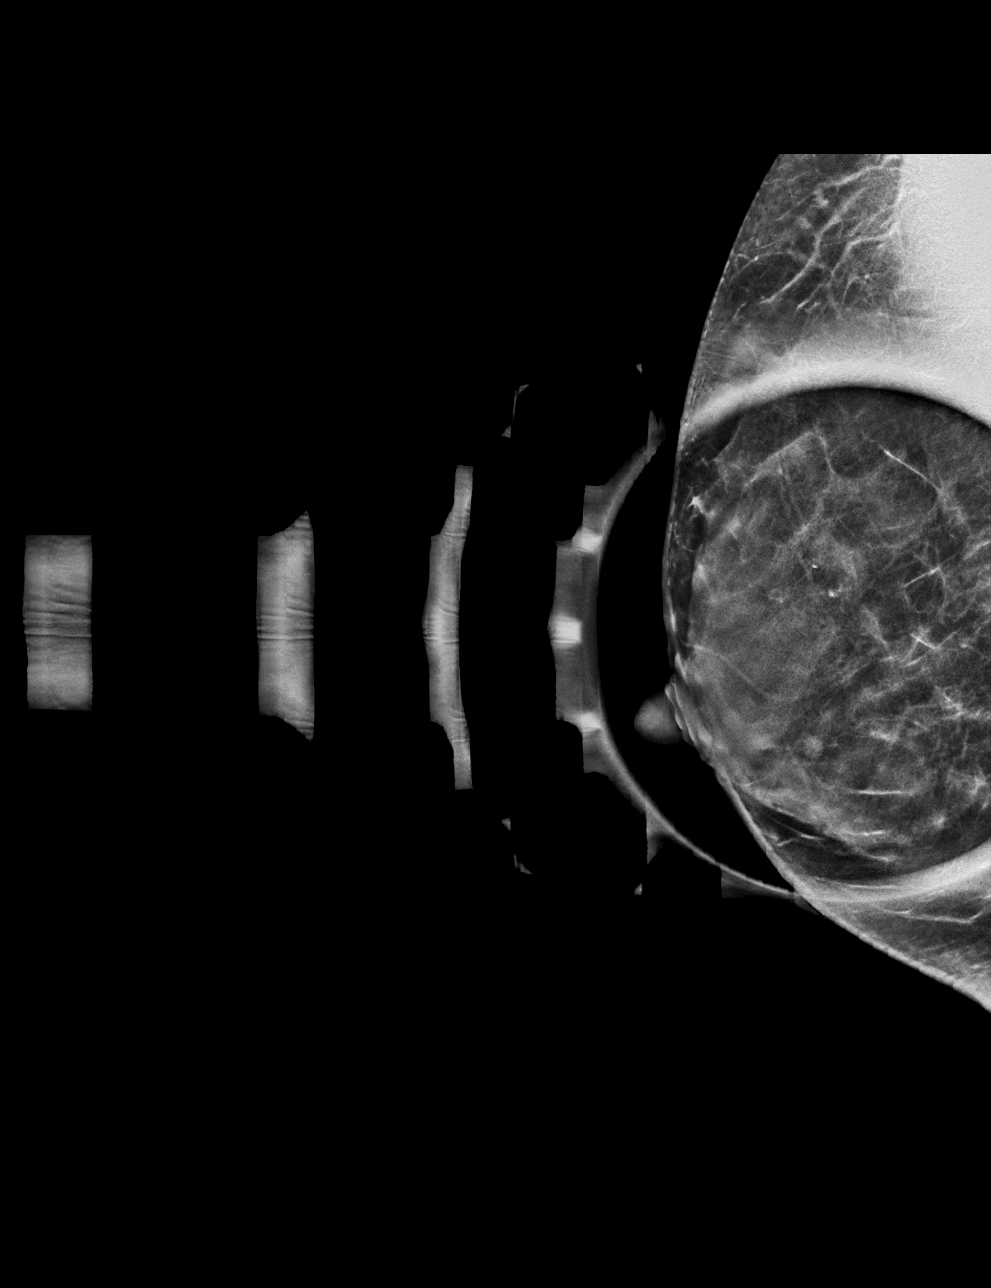

[R CC synth-2D]
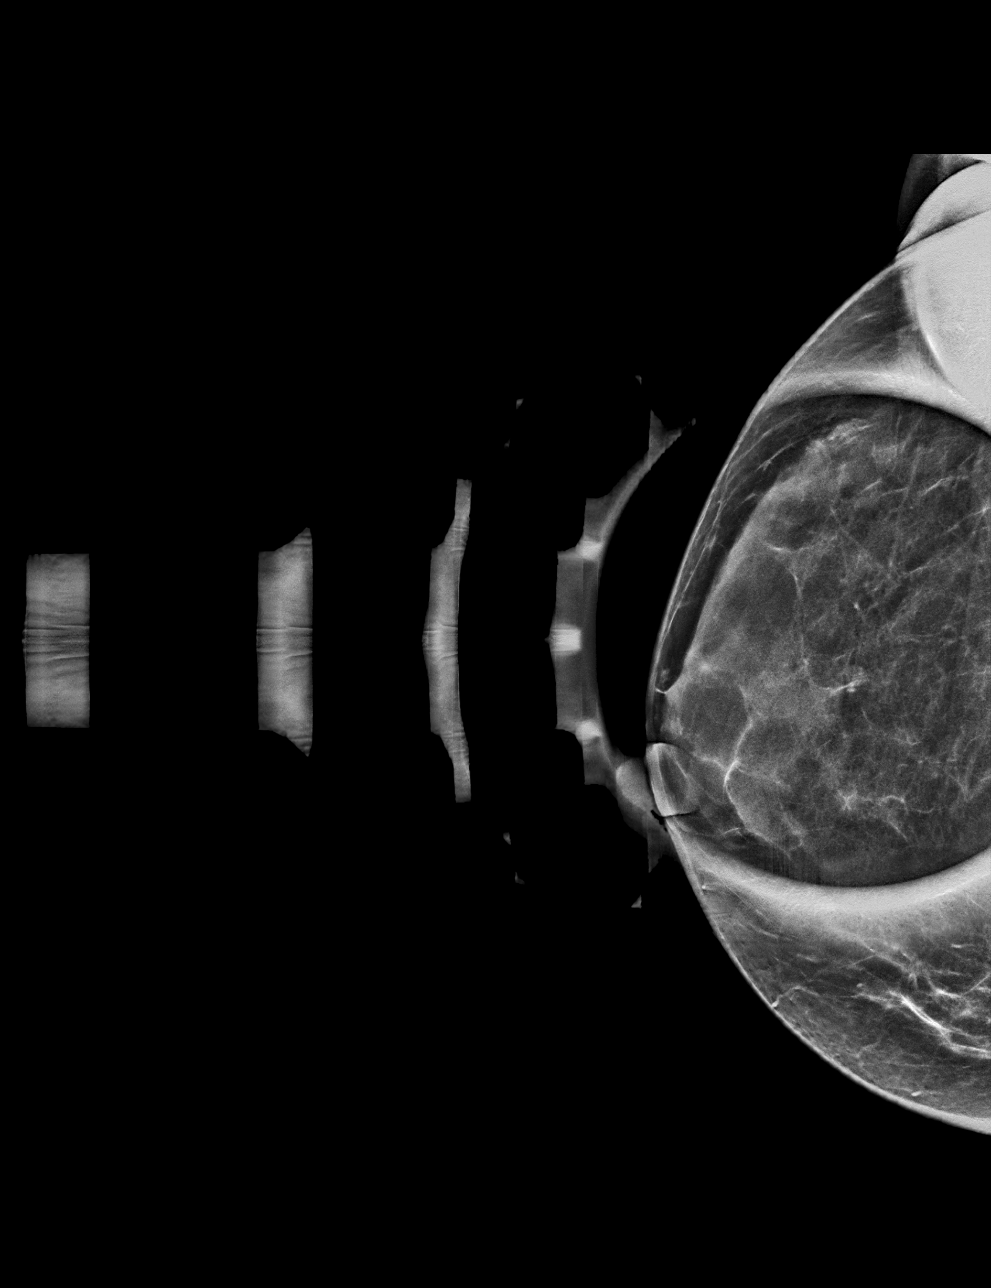

[R MLO tomo · tomo slice 23/44.0]
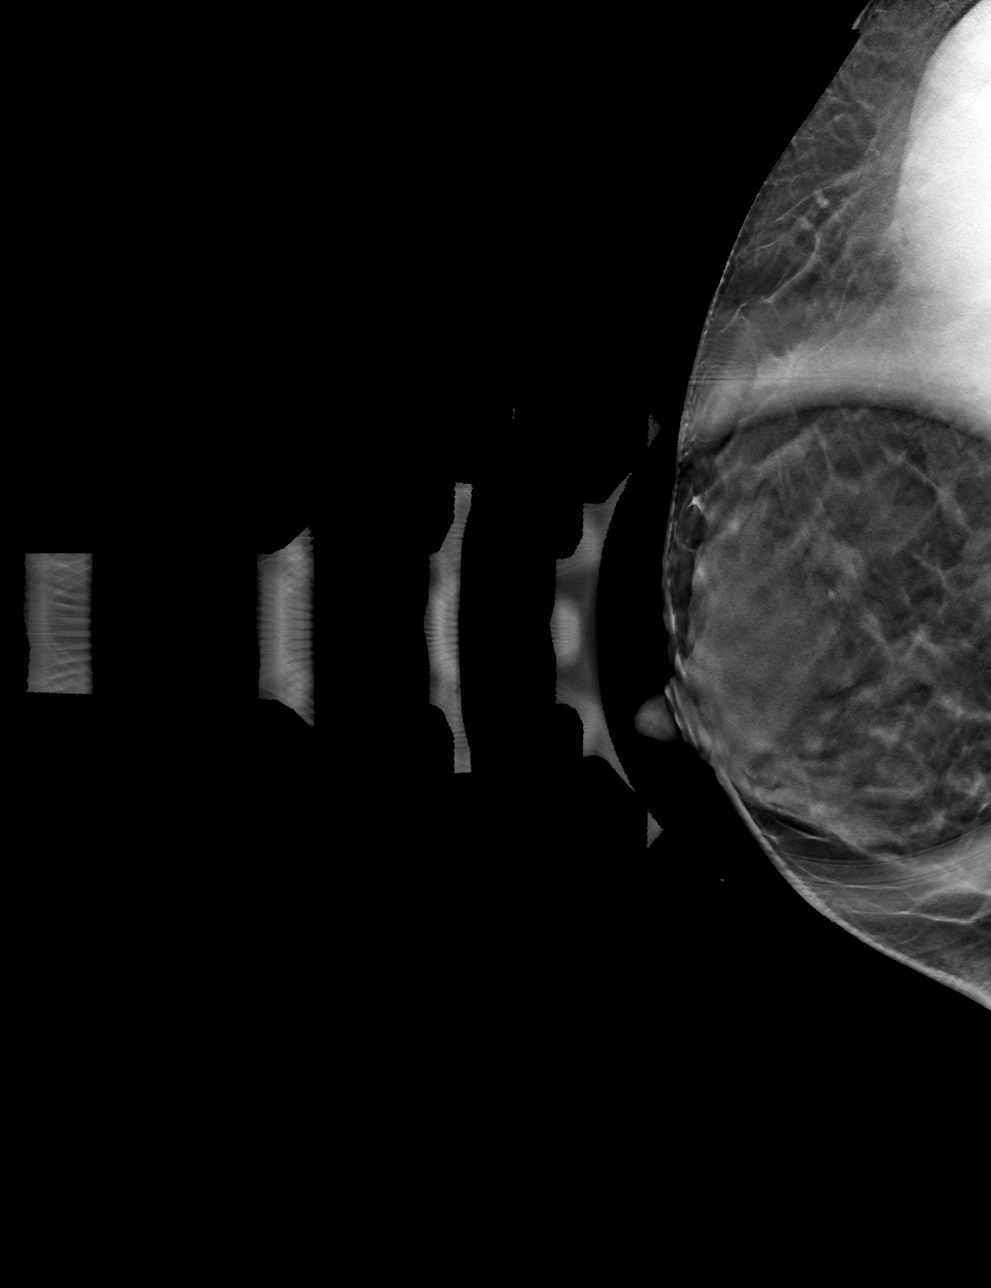

[R CC tomo · tomo slice 23/45.0]
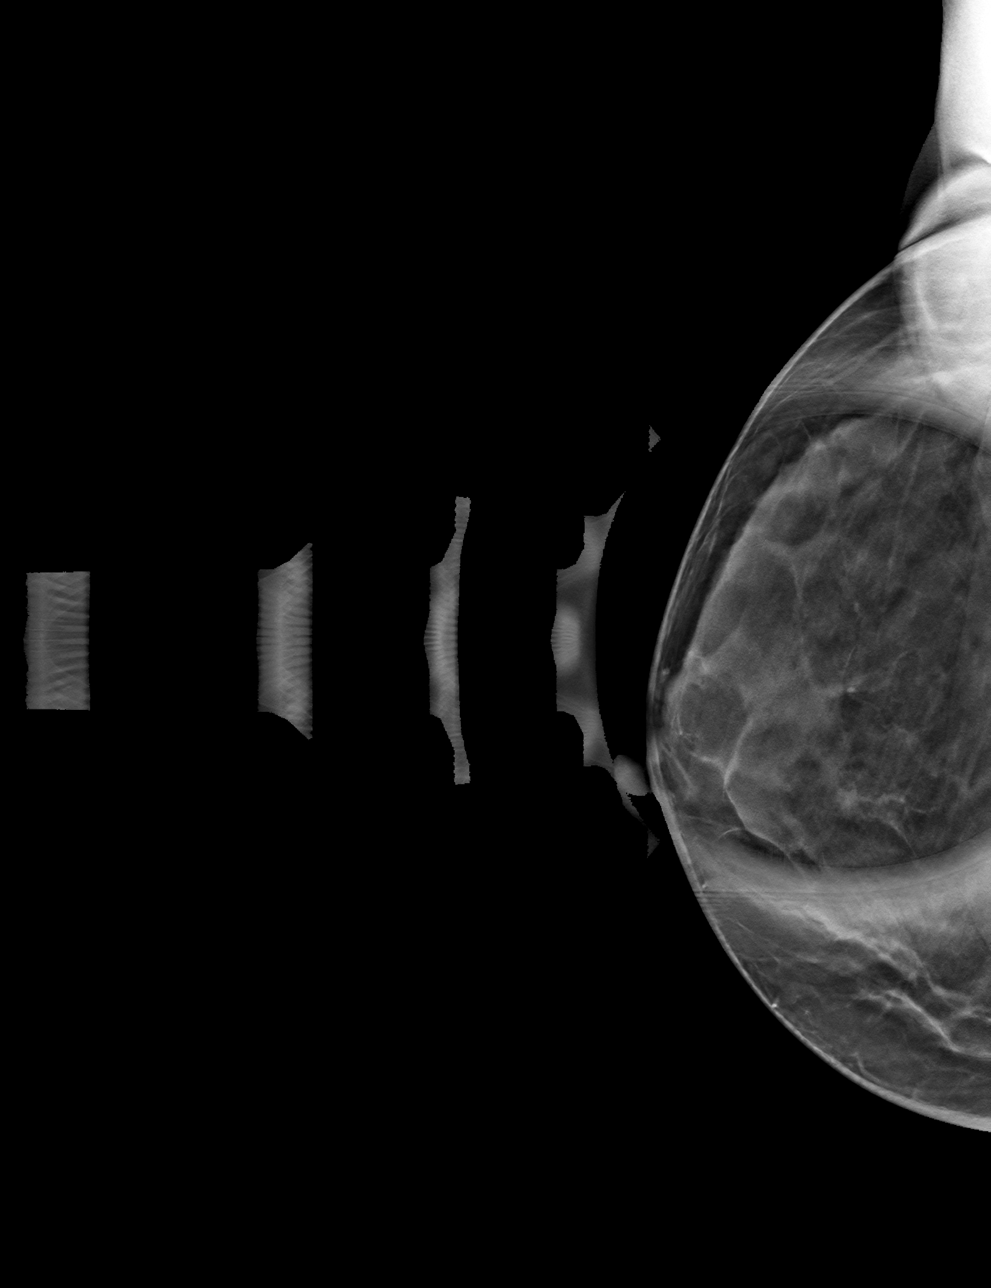

[4 of 12 positions shown; findings below may reference images not displayed]

ACR Breast Density Category c: The breast tissue is heterogeneously
dense, which may obscure small masses.
FINDINGS: Additional 2-D and 3-D images are performed. These views confirm
presence of a circumscribed oval mass in the anterior LOWER OUTER
QUADRANT of the RIGHT breast.

Targeted ultrasound is performed, showing a circumscribed hypoechoic
mass with hyperechoic center in the 7 o'clock location of the RIGHT
breast 3 centimeters from the nipple. Mass measures 0.3 x 0.4 x
centimeters. Internal blood flow is noted, consistent with
intramammary lymph node.
IMPRESSION: Benign intramammary lymph node in the RIGHT breast. No mammographic
or ultrasound evidence for malignancy.

RECOMMENDATION:
Screening mammogram in one year.(Code:9Y-P-LOG)

I have discussed the findings and recommendations with the patient.
If applicable, a reminder letter will be sent to the patient
regarding the next appointment.

BI-RADS CATEGORY  2: Benign.
